# Patient Record
Sex: Male | Born: 1995 | Race: Asian | Hispanic: No | Marital: Single | Smoking: Never smoker
Health system: Southern US, Community
[De-identification: ages and names within clinical notes are randomized; demographics above are authoritative.]

## PROBLEM LIST (undated history)

## (undated) DIAGNOSIS — S36039A Unspecified laceration of spleen, initial encounter: Secondary | ICD-10-CM

## (undated) DIAGNOSIS — S36113A Laceration of liver, unspecified degree, initial encounter: Secondary | ICD-10-CM

## (undated) DIAGNOSIS — S272XXA Traumatic hemopneumothorax, initial encounter: Secondary | ICD-10-CM

## (undated) DIAGNOSIS — A419 Sepsis, unspecified organism: Secondary | ICD-10-CM

## (undated) DIAGNOSIS — W3400XA Accidental discharge from unspecified firearms or gun, initial encounter: Secondary | ICD-10-CM

## (undated) DIAGNOSIS — J189 Pneumonia, unspecified organism: Secondary | ICD-10-CM

## (undated) DIAGNOSIS — S2231XA Fracture of one rib, right side, initial encounter for closed fracture: Secondary | ICD-10-CM

## (undated) HISTORY — PX: GASTRORRHAPHY: SHX6263

---

## 2017-07-19 ENCOUNTER — Emergency Department (HOSPITAL_COMMUNITY): Payer: Self-pay | Admitting: Anesthesiology

## 2017-07-19 ENCOUNTER — Emergency Department (HOSPITAL_COMMUNITY): Payer: Self-pay

## 2017-07-19 ENCOUNTER — Inpatient Hospital Stay (HOSPITAL_COMMUNITY): Payer: Self-pay

## 2017-07-19 ENCOUNTER — Other Ambulatory Visit: Payer: Self-pay

## 2017-07-19 ENCOUNTER — Encounter (HOSPITAL_COMMUNITY): Payer: Self-pay | Admitting: General Surgery

## 2017-07-19 ENCOUNTER — Inpatient Hospital Stay (HOSPITAL_COMMUNITY)
Admission: EM | Admit: 2017-07-19 | Discharge: 2017-07-26 | DRG: 958 | Disposition: A | Discharge status: Home / Self Care | Payer: Self-pay | Attending: General Surgery | Admitting: General Surgery

## 2017-07-19 ENCOUNTER — Encounter (HOSPITAL_COMMUNITY): Admission: EM | Disposition: A | Discharge status: Home / Self Care | Payer: Self-pay | Source: Home / Self Care

## 2017-07-19 DIAGNOSIS — J939 Pneumothorax, unspecified: Secondary | ICD-10-CM

## 2017-07-19 DIAGNOSIS — S272XXA Traumatic hemopneumothorax, initial encounter: Secondary | ICD-10-CM | POA: Diagnosis present

## 2017-07-19 DIAGNOSIS — S2243XA Multiple fractures of ribs, bilateral, initial encounter for closed fracture: Secondary | ICD-10-CM | POA: Diagnosis present

## 2017-07-19 DIAGNOSIS — N179 Acute kidney failure, unspecified: Secondary | ICD-10-CM | POA: Diagnosis present

## 2017-07-19 DIAGNOSIS — Z0189 Encounter for other specified special examinations: Secondary | ICD-10-CM

## 2017-07-19 DIAGNOSIS — E872 Acidosis: Secondary | ICD-10-CM | POA: Diagnosis present

## 2017-07-19 DIAGNOSIS — R05 Cough: Secondary | ICD-10-CM

## 2017-07-19 DIAGNOSIS — Z4682 Encounter for fitting and adjustment of non-vascular catheter: Secondary | ICD-10-CM

## 2017-07-19 DIAGNOSIS — W3400XA Accidental discharge from unspecified firearms or gun, initial encounter: Secondary | ICD-10-CM

## 2017-07-19 DIAGNOSIS — S21331A Puncture wound without foreign body of right front wall of thorax with penetration into thoracic cavity, initial encounter: Principal | ICD-10-CM | POA: Diagnosis present

## 2017-07-19 DIAGNOSIS — D62 Acute posthemorrhagic anemia: Secondary | ICD-10-CM | POA: Diagnosis not present

## 2017-07-19 DIAGNOSIS — Z9889 Other specified postprocedural states: Secondary | ICD-10-CM

## 2017-07-19 DIAGNOSIS — S36039A Unspecified laceration of spleen, initial encounter: Secondary | ICD-10-CM | POA: Diagnosis present

## 2017-07-19 DIAGNOSIS — R059 Cough, unspecified: Secondary | ICD-10-CM

## 2017-07-19 DIAGNOSIS — S36113A Laceration of liver, unspecified degree, initial encounter: Secondary | ICD-10-CM | POA: Diagnosis present

## 2017-07-19 DIAGNOSIS — S0001XA Abrasion of scalp, initial encounter: Secondary | ICD-10-CM | POA: Diagnosis present

## 2017-07-19 DIAGNOSIS — Y249XXA Unspecified firearm discharge, undetermined intent, initial encounter: Secondary | ICD-10-CM

## 2017-07-19 DIAGNOSIS — R402412 Glasgow coma scale score 13-15, at arrival to emergency department: Secondary | ICD-10-CM | POA: Diagnosis present

## 2017-07-19 DIAGNOSIS — R739 Hyperglycemia, unspecified: Secondary | ICD-10-CM | POA: Diagnosis present

## 2017-07-19 DIAGNOSIS — S81032A Puncture wound without foreign body, left knee, initial encounter: Secondary | ICD-10-CM | POA: Diagnosis present

## 2017-07-19 DIAGNOSIS — S31141A Puncture wound of abdominal wall with foreign body, left upper quadrant without penetration into peritoneal cavity, initial encounter: Secondary | ICD-10-CM | POA: Diagnosis present

## 2017-07-19 DIAGNOSIS — E876 Hypokalemia: Secondary | ICD-10-CM | POA: Diagnosis present

## 2017-07-19 HISTORY — PX: LAPAROTOMY: SHX154

## 2017-07-19 LAB — CBC
HCT: 36 % — ABNORMAL LOW (ref 39.0–52.0)
HEMATOCRIT: 31.7 % — AB (ref 39.0–52.0)
HEMATOCRIT: 40.9 % (ref 39.0–52.0)
HEMOGLOBIN: 11.5 g/dL — AB (ref 13.0–17.0)
Hemoglobin: 10.6 g/dL — ABNORMAL LOW (ref 13.0–17.0)
Hemoglobin: 13.6 g/dL (ref 13.0–17.0)
MCH: 30.1 pg (ref 26.0–34.0)
MCH: 30.6 pg (ref 26.0–34.0)
MCH: 30.7 pg (ref 26.0–34.0)
MCHC: 31.9 g/dL (ref 30.0–36.0)
MCHC: 33.3 g/dL (ref 30.0–36.0)
MCHC: 33.4 g/dL (ref 30.0–36.0)
MCV: 91.6 fL (ref 78.0–100.0)
MCV: 92.3 fL (ref 78.0–100.0)
MCV: 94.2 fL (ref 78.0–100.0)
PLATELETS: 215 10*3/uL (ref 150–400)
PLATELETS: 226 10*3/uL (ref 150–400)
Platelets: 180 10*3/uL (ref 150–400)
RBC: 3.46 MIL/uL — ABNORMAL LOW (ref 4.22–5.81)
RBC: 3.82 MIL/uL — AB (ref 4.22–5.81)
RBC: 4.43 MIL/uL (ref 4.22–5.81)
RDW: 12.6 % (ref 11.5–15.5)
RDW: 12.7 % (ref 11.5–15.5)
RDW: 12.8 % (ref 11.5–15.5)
WBC: 12 10*3/uL — AB (ref 4.0–10.5)
WBC: 20.5 10*3/uL — AB (ref 4.0–10.5)
WBC: 23.8 10*3/uL — ABNORMAL HIGH (ref 4.0–10.5)

## 2017-07-19 LAB — TYPE AND SCREEN
ABO/RH(D): O POS
ANTIBODY SCREEN: NEGATIVE
Unit division: 0
Unit division: 0

## 2017-07-19 LAB — URINALYSIS, ROUTINE W REFLEX MICROSCOPIC
Bacteria, UA: NONE SEEN
Bilirubin Urine: NEGATIVE
GLUCOSE, UA: 150 mg/dL — AB
HGB URINE DIPSTICK: NEGATIVE
Ketones, ur: NEGATIVE mg/dL
Leukocytes, UA: NEGATIVE
NITRITE: NEGATIVE
Protein, ur: 100 mg/dL — AB
SPECIFIC GRAVITY, URINE: 1.033 — AB (ref 1.005–1.030)
pH: 5 (ref 5.0–8.0)

## 2017-07-19 LAB — PREPARE FRESH FROZEN PLASMA
UNIT DIVISION: 0
Unit division: 0

## 2017-07-19 LAB — BLOOD PRODUCT ORDER (VERBAL) VERIFICATION

## 2017-07-19 LAB — ETHANOL

## 2017-07-19 LAB — I-STAT CHEM 8, ED
BUN: 9 mg/dL (ref 6–20)
CHLORIDE: 107 mmol/L (ref 101–111)
CREATININE: 1.2 mg/dL (ref 0.61–1.24)
Calcium, Ion: 1.02 mmol/L — ABNORMAL LOW (ref 1.15–1.40)
Glucose, Bld: 146 mg/dL — ABNORMAL HIGH (ref 65–99)
HEMATOCRIT: 40 % (ref 39.0–52.0)
Hemoglobin: 13.6 g/dL (ref 13.0–17.0)
Potassium: 3.1 mmol/L — ABNORMAL LOW (ref 3.5–5.1)
SODIUM: 142 mmol/L (ref 135–145)
TCO2: 21 mmol/L — AB (ref 22–32)

## 2017-07-19 LAB — BPAM RBC
BLOOD PRODUCT EXPIRATION DATE: 201905272359
Blood Product Expiration Date: 201905272359
ISSUE DATE / TIME: 201905010006
ISSUE DATE / TIME: 201905010006
UNIT TYPE AND RH: 9500
UNIT TYPE AND RH: 9500

## 2017-07-19 LAB — BASIC METABOLIC PANEL
ANION GAP: 10 (ref 5–15)
BUN: 11 mg/dL (ref 6–20)
CHLORIDE: 106 mmol/L (ref 101–111)
CO2: 24 mmol/L (ref 22–32)
CREATININE: 1.25 mg/dL — AB (ref 0.61–1.24)
Calcium: 7.5 mg/dL — ABNORMAL LOW (ref 8.9–10.3)
GFR calc Af Amer: >60 mL/min (ref >60)
GFR calc non Af Amer: >60 mL/min (ref >60)
Glucose, Bld: 221 mg/dL — ABNORMAL HIGH (ref 65–99)
POTASSIUM: 4.2 mmol/L (ref 3.5–5.1)
SODIUM: 140 mmol/L (ref 135–145)

## 2017-07-19 LAB — COMPREHENSIVE METABOLIC PANEL
ALBUMIN: 3.6 g/dL (ref 3.5–5.0)
ALK PHOS: 49 U/L (ref 38–126)
ALT: 220 U/L — AB (ref 17–63)
AST: 204 U/L — AB (ref 15–41)
Anion gap: 10 (ref 5–15)
BUN: 8 mg/dL (ref 6–20)
CALCIUM: 8.4 mg/dL — AB (ref 8.9–10.3)
CO2: 21 mmol/L — ABNORMAL LOW (ref 22–32)
CREATININE: 1.26 mg/dL — AB (ref 0.61–1.24)
Chloride: 108 mmol/L (ref 101–111)
GFR calc Af Amer: >60 mL/min (ref >60)
GLUCOSE: 145 mg/dL — AB (ref 65–99)
POTASSIUM: 3.2 mmol/L — AB (ref 3.5–5.1)
Sodium: 139 mmol/L (ref 135–145)
Total Bilirubin: 0.7 mg/dL (ref 0.3–1.2)
Total Protein: 6.4 g/dL — ABNORMAL LOW (ref 6.5–8.1)

## 2017-07-19 LAB — BPAM FFP
BLOOD PRODUCT EXPIRATION DATE: 201905012359
BLOOD PRODUCT EXPIRATION DATE: 201905012359
ISSUE DATE / TIME: 201905010007
ISSUE DATE / TIME: 201905010007
UNIT TYPE AND RH: 6200
Unit Type and Rh: 6200

## 2017-07-19 LAB — RAPID URINE DRUG SCREEN, HOSP PERFORMED
AMPHETAMINES: NOT DETECTED
BENZODIAZEPINES: NOT DETECTED
Barbiturates: NOT DETECTED
Cocaine: NOT DETECTED
Opiates: POSITIVE — AB
Tetrahydrocannabinol: POSITIVE — AB

## 2017-07-19 LAB — HIV ANTIBODY (ROUTINE TESTING W REFLEX): HIV SCREEN 4TH GENERATION: NONREACTIVE

## 2017-07-19 LAB — I-STAT CG4 LACTIC ACID, ED: Lactic Acid, Venous: 3.43 mmol/L (ref 0.5–1.9)

## 2017-07-19 LAB — PROTIME-INR
INR: 1.13
PROTHROMBIN TIME: 14.4 s (ref 11.4–15.2)

## 2017-07-19 LAB — CDS SEROLOGY

## 2017-07-19 LAB — MRSA PCR SCREENING: MRSA by PCR: NEGATIVE

## 2017-07-19 LAB — ABO/RH: ABO/RH(D): O POS

## 2017-07-19 SURGERY — LAPAROTOMY, EXPLORATORY
Anesthesia: General

## 2017-07-19 MED ORDER — PROPOFOL 10 MG/ML IV BOLUS
INTRAVENOUS | Status: AC
  Filled 2017-07-19: qty 20

## 2017-07-19 MED ORDER — ORAL CARE MOUTH RINSE
15.0000 mL | Freq: Two times a day (BID) | OROMUCOSAL | Status: DC
  Administered 2017-07-19 – 2017-07-26 (×12): 15 mL via OROMUCOSAL

## 2017-07-19 MED ORDER — PROPOFOL 10 MG/ML IV BOLUS
INTRAVENOUS | Status: DC | PRN
  Administered 2017-07-19: 140 mg via INTRAVENOUS

## 2017-07-19 MED ORDER — ONDANSETRON HCL 4 MG/2ML IJ SOLN: 4.0000 mg | Freq: Four times a day (QID) | INTRAMUSCULAR | Status: DC | PRN

## 2017-07-19 MED ORDER — FENTANYL CITRATE (PF) 250 MCG/5ML IJ SOLN
INTRAMUSCULAR | Status: AC
  Filled 2017-07-19: qty 5

## 2017-07-19 MED ORDER — SODIUM CHLORIDE 0.9 % IV SOLN
500.0000 mL | Freq: Once | INTRAVENOUS | Status: AC
  Administered 2017-07-19: 500 mL via INTRAVENOUS

## 2017-07-19 MED ORDER — LIDOCAINE HCL (CARDIAC) PF 100 MG/5ML IV SOSY
PREFILLED_SYRINGE | INTRAVENOUS | Status: DC | PRN
  Administered 2017-07-19: 50 mg via INTRAVENOUS

## 2017-07-19 MED ORDER — HYDROMORPHONE HCL 2 MG/ML IJ SOLN
INTRAMUSCULAR | Status: AC
  Filled 2017-07-19: qty 1

## 2017-07-19 MED ORDER — ONDANSETRON HCL 4 MG/2ML IJ SOLN
4.0000 mg | Freq: Four times a day (QID) | INTRAMUSCULAR | Status: DC | PRN
  Administered 2017-07-19 – 2017-07-25 (×4): 4 mg via INTRAVENOUS
  Filled 2017-07-19 (×4): qty 2

## 2017-07-19 MED ORDER — TETANUS-DIPHTH-ACELL PERTUSSIS 5-2.5-18.5 LF-MCG/0.5 IM SUSP: 0.5000 mL | Freq: Once | INTRAMUSCULAR | Status: DC

## 2017-07-19 MED ORDER — PHENYLEPHRINE HCL 10 MG/ML IJ SOLN
INTRAVENOUS | Status: DC | PRN
  Administered 2017-07-19: 50 ug/min via INTRAVENOUS

## 2017-07-19 MED ORDER — 0.9 % SODIUM CHLORIDE (POUR BTL) OPTIME
TOPICAL | Status: DC | PRN
  Administered 2017-07-19: 5000 mL

## 2017-07-19 MED ORDER — ACETAMINOPHEN 650 MG RE SUPP: 650.0000 mg | Freq: Four times a day (QID) | RECTAL | Status: DC | PRN

## 2017-07-19 MED ORDER — CEFAZOLIN SODIUM-DEXTROSE 2-3 GM-%(50ML) IV SOLR
INTRAVENOUS | Status: DC | PRN
  Administered 2017-07-19: 2 g via INTRAVENOUS

## 2017-07-19 MED ORDER — PHENYLEPHRINE HCL 10 MG/ML IJ SOLN
INTRAMUSCULAR | Status: DC | PRN
  Administered 2017-07-19: 80 ug via INTRAVENOUS

## 2017-07-19 MED ORDER — ONDANSETRON HCL 4 MG/2ML IJ SOLN
4.0000 mg | Freq: Four times a day (QID) | INTRAMUSCULAR | Status: DC | PRN
  Administered 2017-07-19 – 2017-07-20 (×2): 4 mg via INTRAVENOUS
  Filled 2017-07-19 (×2): qty 2

## 2017-07-19 MED ORDER — SUCCINYLCHOLINE CHLORIDE 200 MG/10ML IV SOSY
PREFILLED_SYRINGE | INTRAVENOUS | Status: AC
  Filled 2017-07-19: qty 10

## 2017-07-19 MED ORDER — HYDROMORPHONE HCL 2 MG/ML IJ SOLN
0.2500 mg | INTRAMUSCULAR | Status: DC | PRN
  Administered 2017-07-19 (×2): 0.5 mg via INTRAVENOUS

## 2017-07-19 MED ORDER — SODIUM CHLORIDE 0.9% FLUSH
9.0000 mL | INTRAVENOUS | Status: DC | PRN
  Administered 2017-07-23: 9 mL via INTRAVENOUS
  Filled 2017-07-19: qty 9

## 2017-07-19 MED ORDER — MORPHINE SULFATE 2 MG/ML IV SOLN
INTRAVENOUS | Status: DC
  Administered 2017-07-19: 04:00:00 via INTRAVENOUS
  Administered 2017-07-19: 3 mg via INTRAVENOUS
  Administered 2017-07-19: 5.7 mg via INTRAVENOUS
  Administered 2017-07-19: 9 mg via INTRAVENOUS
  Administered 2017-07-19: 10.5 mg via INTRAVENOUS
  Administered 2017-07-19: 22:00:00 via INTRAVENOUS
  Administered 2017-07-20: 9 mg via INTRAVENOUS
  Administered 2017-07-20: 6 mg via INTRAVENOUS
  Administered 2017-07-20: via INTRAVENOUS
  Administered 2017-07-20 (×2): 6 mg via INTRAVENOUS
  Administered 2017-07-20: 9 mg via INTRAVENOUS
  Administered 2017-07-20: 4.5 mg via INTRAVENOUS
  Administered 2017-07-20: 7 mg via INTRAVENOUS
  Administered 2017-07-21: 10.5 mg via INTRAVENOUS
  Administered 2017-07-21: 18 mg via INTRAVENOUS
  Administered 2017-07-21: 3 mg via INTRAVENOUS
  Administered 2017-07-21: 10.5 mg via INTRAVENOUS
  Administered 2017-07-21: 7.5 mg via INTRAVENOUS
  Administered 2017-07-21 – 2017-07-22 (×3): via INTRAVENOUS
  Administered 2017-07-22: 21.3 mg via INTRAVENOUS
  Administered 2017-07-22: 2 mg via INTRAVENOUS
  Administered 2017-07-22: 9 mg via INTRAVENOUS
  Administered 2017-07-23 (×3): via INTRAVENOUS
  Filled 2017-07-19: qty 30
  Filled 2017-07-19 (×8): qty 25

## 2017-07-19 MED ORDER — LIDOCAINE 2% (20 MG/ML) 5 ML SYRINGE
INTRAMUSCULAR | Status: AC
  Filled 2017-07-19: qty 5

## 2017-07-19 MED ORDER — DEXTROSE IN LACTATED RINGERS 5 % IV SOLN
INTRAVENOUS | Status: DC
  Administered 2017-07-19: 100 mL/h via INTRAVENOUS
  Administered 2017-07-19 – 2017-07-21 (×4): via INTRAVENOUS
  Administered 2017-07-22: 100 mL/h via INTRAVENOUS

## 2017-07-19 MED ORDER — SODIUM CHLORIDE 0.9 % IV SOLN
INTRAVENOUS | Status: DC | PRN
  Administered 2017-07-19 (×2): via INTRAVENOUS

## 2017-07-19 MED ORDER — SUGAMMADEX SODIUM 200 MG/2ML IV SOLN
INTRAVENOUS | Status: DC | PRN
  Administered 2017-07-19: 200 mg via INTRAVENOUS

## 2017-07-19 MED ORDER — DIPHENHYDRAMINE HCL 50 MG/ML IJ SOLN: 12.5000 mg | Freq: Four times a day (QID) | INTRAMUSCULAR | Status: DC | PRN

## 2017-07-19 MED ORDER — ONDANSETRON 4 MG PO TBDP
4.0000 mg | ORAL_TABLET | Freq: Four times a day (QID) | ORAL | Status: DC | PRN
  Administered 2017-07-25: 4 mg via ORAL
  Filled 2017-07-19: qty 1

## 2017-07-19 MED ORDER — SUCCINYLCHOLINE CHLORIDE 20 MG/ML IJ SOLN
INTRAMUSCULAR | Status: DC | PRN
  Administered 2017-07-19: 100 mg via INTRAVENOUS

## 2017-07-19 MED ORDER — ONDANSETRON HCL 4 MG/2ML IJ SOLN
INTRAMUSCULAR | Status: DC | PRN
  Administered 2017-07-19: 4 mg via INTRAVENOUS

## 2017-07-19 MED ORDER — SODIUM CHLORIDE 0.9 % IV SOLN
INTRAVENOUS | Status: DC | PRN
  Administered 2017-07-19: 01:00:00 via INTRAVENOUS

## 2017-07-19 MED ORDER — SODIUM CHLORIDE 0.9 % IV BOLUS
1000.0000 mL | Freq: Once | INTRAVENOUS | Status: AC
  Administered 2017-07-19: 1000 mL via INTRAVENOUS

## 2017-07-19 MED ORDER — ROCURONIUM BROMIDE 100 MG/10ML IV SOLN
INTRAVENOUS | Status: DC | PRN
  Administered 2017-07-19: 50 mg via INTRAVENOUS

## 2017-07-19 MED ORDER — OXYCODONE HCL 5 MG/5ML PO SOLN: 5.0000 mg | Freq: Once | ORAL | Status: DC | PRN

## 2017-07-19 MED ORDER — ONDANSETRON HCL 4 MG/2ML IJ SOLN
4.0000 mg | Freq: Once | INTRAMUSCULAR | Status: AC
  Administered 2017-07-19: 4 mg via INTRAVENOUS
  Filled 2017-07-19: qty 2

## 2017-07-19 MED ORDER — FENTANYL CITRATE (PF) 100 MCG/2ML IJ SOLN
INTRAMUSCULAR | Status: DC | PRN
  Administered 2017-07-19 (×2): 50 ug via INTRAVENOUS
  Administered 2017-07-19: 150 ug via INTRAVENOUS

## 2017-07-19 MED ORDER — FENTANYL CITRATE (PF) 100 MCG/2ML IJ SOLN
50.0000 ug | Freq: Once | INTRAMUSCULAR | Status: AC
  Administered 2017-07-19: 50 ug via INTRAVENOUS
  Filled 2017-07-19: qty 2

## 2017-07-19 MED ORDER — CEFAZOLIN SODIUM-DEXTROSE 2-4 GM/100ML-% IV SOLN
2.0000 g | Freq: Three times a day (TID) | INTRAVENOUS | Status: AC
Start: 2017-07-19 — End: 2017-07-21
  Administered 2017-07-19 – 2017-07-21 (×6): 2 g via INTRAVENOUS
  Filled 2017-07-19 (×7): qty 100

## 2017-07-19 MED ORDER — NALOXONE HCL 0.4 MG/ML IJ SOLN: 0.4000 mg | INTRAMUSCULAR | Status: DC | PRN

## 2017-07-19 MED ORDER — DIPHENHYDRAMINE HCL 12.5 MG/5ML PO ELIX: 12.5000 mg | ORAL_SOLUTION | Freq: Four times a day (QID) | ORAL | Status: DC | PRN

## 2017-07-19 MED ORDER — OXYCODONE HCL 5 MG PO TABS: 5.0000 mg | ORAL_TABLET | Freq: Once | ORAL | Status: DC | PRN

## 2017-07-19 MED ORDER — FAMOTIDINE IN NACL 20-0.9 MG/50ML-% IV SOLN
20.0000 mg | Freq: Two times a day (BID) | INTRAVENOUS | Status: DC
  Administered 2017-07-19 – 2017-07-25 (×13): 20 mg via INTRAVENOUS
  Filled 2017-07-19 (×12): qty 50

## 2017-07-19 SURGICAL SUPPLY — 48 items
BLADE CLIPPER SURG (BLADE) IMPLANT
CANISTER SUCT 3000ML PPV (MISCELLANEOUS) ×3 IMPLANT
CHLORAPREP W/TINT 26ML (MISCELLANEOUS) ×3 IMPLANT
COVER SURGICAL LIGHT HANDLE (MISCELLANEOUS) ×3 IMPLANT
DRAPE LAPAROSCOPIC ABDOMINAL (DRAPES) ×3 IMPLANT
DRAPE WARM FLUID 44X44 (DRAPE) ×3 IMPLANT
DRSG COVADERM 4X10 (GAUZE/BANDAGES/DRESSINGS) ×3 IMPLANT
DRSG OPSITE POSTOP 4X10 (GAUZE/BANDAGES/DRESSINGS) IMPLANT
DRSG OPSITE POSTOP 4X8 (GAUZE/BANDAGES/DRESSINGS) IMPLANT
ELECT BLADE 6.5 EXT (BLADE) IMPLANT
ELECT CAUTERY BLADE 6.4 (BLADE) ×3 IMPLANT
ELECT REM PT RETURN 9FT ADLT (ELECTROSURGICAL) ×3
ELECTRODE REM PT RTRN 9FT ADLT (ELECTROSURGICAL) ×1 IMPLANT
GLOVE BIO SURGEON STRL SZ 6 (GLOVE) ×3 IMPLANT
GLOVE INDICATOR 6.5 STRL GRN (GLOVE) ×3 IMPLANT
GOWN STRL REUS W/ TWL LRG LVL3 (GOWN DISPOSABLE) ×1 IMPLANT
GOWN STRL REUS W/TWL 2XL LVL3 (GOWN DISPOSABLE) ×3 IMPLANT
GOWN STRL REUS W/TWL LRG LVL3 (GOWN DISPOSABLE) ×2
KIT BASIN OR (CUSTOM PROCEDURE TRAY) ×3 IMPLANT
KIT TURNOVER KIT B (KITS) ×3 IMPLANT
LIGASURE IMPACT 36 18CM CVD LR (INSTRUMENTS) IMPLANT
NS IRRIG 1000ML POUR BTL (IV SOLUTION) ×6 IMPLANT
PACK GENERAL/GYN (CUSTOM PROCEDURE TRAY) ×3 IMPLANT
PAD ARMBOARD 7.5X6 YLW CONV (MISCELLANEOUS) ×3 IMPLANT
PLEDGET 1/4X1/8X1/16 (MISCELLANEOUS) ×1 IMPLANT
PLEDGETS 1/4X1/8X1/16 (MISCELLANEOUS) ×3
RELOAD STAPLER LINE PROX 30 GR (STAPLE) ×1 IMPLANT
SPECIMEN JAR LARGE (MISCELLANEOUS) IMPLANT
SPONGE LAP 18X18 X RAY DECT (DISPOSABLE) IMPLANT
STAPLER RELOAD LINE PROX 30 GR (STAPLE) ×3
STAPLER VISISTAT 35W (STAPLE) ×3 IMPLANT
SUCTION POOLE TIP (SUCTIONS) ×3 IMPLANT
SUT PDS II 0 TP-1 LOOPED 60 (SUTURE) ×6 IMPLANT
SUT PROLENE 2 0 CT2 30 (SUTURE) ×12 IMPLANT
SUT SILK 1 SH (SUTURE) ×3 IMPLANT
SUT VIC AB 2-0 SH 18 (SUTURE) ×3 IMPLANT
SUT VIC AB 2-0 SH 27 (SUTURE) ×2
SUT VIC AB 2-0 SH 27XBRD (SUTURE) ×1 IMPLANT
SUT VIC AB 3-0 SH 18 (SUTURE) ×3 IMPLANT
SUT VICRYL 4-0 PS2 18IN ABS (SUTURE) IMPLANT
SUT VICRYL AB 2 0 TIES (SUTURE) ×3 IMPLANT
SUT VICRYL AB 3 0 TIES (SUTURE) ×3 IMPLANT
SYSTEM SAHARA CHEST DRAIN ATS (WOUND CARE) ×6 IMPLANT
TOWEL OR 17X24 6PK STRL BLUE (TOWEL DISPOSABLE) ×3 IMPLANT
TOWEL OR 17X26 10 PK STRL BLUE (TOWEL DISPOSABLE) ×3 IMPLANT
TRAY FOLEY MTR SLVR 16FR STAT (SET/KITS/TRAYS/PACK) IMPLANT
TRAY WAYNE PNEUMOTHORAX 14X18 (TRAY / TRAY PROCEDURE) ×6 IMPLANT
YANKAUER SUCT BULB TIP NO VENT (SUCTIONS) IMPLANT
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
## 2017-07-19 NOTE — Anesthesia Procedure Notes (Signed)
Procedure Name: Intubation Date/Time: 07/19/2017 12:46 AM Performed by: Purvis Kilts, CRNA Pre-anesthesia Checklist: Patient identified, Emergency Drugs available, Suction available, Patient being monitored and Timeout performed Patient Re-evaluated:Patient Re-evaluated prior to induction Oxygen Delivery Method: Circle system utilized Preoxygenation: Pre-oxygenation with 100% oxygen Induction Type: IV induction, Rapid sequence and Cricoid Pressure applied Laryngoscope Size: Mac and 4 Grade View: Grade I Tube type: Oral Tube size: 7.5 mm Number of attempts: 1 Airway Equipment and Method: Stylet Placement Confirmation: ETT inserted through vocal cords under direct vision,  positive ETCO2 and breath sounds checked- equal and bilateral Secured at: 21 cm Tube secured with: Tape Dental Injury: Teeth and Oropharynx as per pre-operative assessment
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
## 2017-07-19 NOTE — Transfer of Care (Signed)
Immediate Anesthesia Transfer of Care Note  Patient: James Little  Procedure(s) Performed: EXPLORATORY LAPAROTOMY (N/A )  Patient Location: PACU  Anesthesia Type:General  Level of Consciousness: awake  Airway & Oxygen Therapy: Patient Spontanous Breathing  Post-op Assessment: Report given to RN and Post -op Vital signs reviewed and stable  Post vital signs: Reviewed and stable  Last Vitals:  Vitals Value Taken Time  BP 126/69 07/19/2017  2:30 AM  Temp 36.2 C 07/19/2017  2:32 AM  Pulse 91 07/19/2017  2:39 AM  Resp 20 07/19/2017  2:39 AM  SpO2 98 % 07/19/2017  2:39 AM  Vitals shown include unvalidated device data.  Last Pain:  Vitals:   07/19/17 0232  TempSrc:   PainSc: Asleep         Complications: No apparent anesthesia complications
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
## 2017-07-19 NOTE — Progress Notes (Signed)
Patientis very stable and looks good.  Will give saline bolus for marginal urine output.  James Little. Dahlia Bailiff, MD, [place] [phone] Trauma Surgeon
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
## 2017-07-20 ENCOUNTER — Encounter (HOSPITAL_COMMUNITY): Payer: Self-pay | Admitting: General Surgery

## 2017-07-20 ENCOUNTER — Inpatient Hospital Stay (HOSPITAL_COMMUNITY): Payer: Self-pay

## 2017-07-20 LAB — CBC WITH DIFFERENTIAL/PLATELET
BASOS PCT: 0 %
Basophils Absolute: 0 10*3/uL (ref 0.0–0.1)
Eosinophils Absolute: 0 10*3/uL (ref 0.0–0.7)
Eosinophils Relative: 0 %
HEMATOCRIT: 25.8 % — AB (ref 39.0–52.0)
HEMOGLOBIN: 8.8 g/dL — AB (ref 13.0–17.0)
Lymphocytes Relative: 13 %
Lymphs Abs: 1.7 10*3/uL (ref 0.7–4.0)
MCH: 30.6 pg (ref 26.0–34.0)
MCHC: 34.1 g/dL (ref 30.0–36.0)
MCV: 89.6 fL (ref 78.0–100.0)
MONOS PCT: 5 %
Monocytes Absolute: 0.7 10*3/uL (ref 0.1–1.0)
NEUTROS ABS: 11.2 10*3/uL — AB (ref 1.7–7.7)
NEUTROS PCT: 82 %
Platelets: 154 10*3/uL (ref 150–400)
RBC: 2.88 MIL/uL — ABNORMAL LOW (ref 4.22–5.81)
RDW: 12.5 % (ref 11.5–15.5)
WBC: 13.7 10*3/uL — ABNORMAL HIGH (ref 4.0–10.5)

## 2017-07-20 LAB — BASIC METABOLIC PANEL
Anion gap: 5 (ref 5–15)
BUN: 8 mg/dL (ref 6–20)
CHLORIDE: 105 mmol/L (ref 101–111)
CO2: 28 mmol/L (ref 22–32)
CREATININE: 1 mg/dL (ref 0.61–1.24)
Calcium: 7.8 mg/dL — ABNORMAL LOW (ref 8.9–10.3)
GFR calc non Af Amer: >60 mL/min (ref >60)
Glucose, Bld: 141 mg/dL — ABNORMAL HIGH (ref 65–99)
Potassium: 4.3 mmol/L (ref 3.5–5.1)
Sodium: 138 mmol/L (ref 135–145)

## 2017-07-20 NOTE — Progress Notes (Signed)
Patient arrived from 4N30, girlfriend at bedside.  Patient oriented to room/unit.  Vitals stable.  xxx status in place with password. Continue to monitor.
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
## 2017-07-21 ENCOUNTER — Inpatient Hospital Stay (HOSPITAL_COMMUNITY): Payer: Self-pay

## 2017-07-21 ENCOUNTER — Encounter (HOSPITAL_COMMUNITY): Payer: Self-pay | Admitting: General Surgery

## 2017-07-21 LAB — BASIC METABOLIC PANEL
Anion gap: 6 (ref 5–15)
BUN: 6 mg/dL (ref 6–20)
CALCIUM: 7.9 mg/dL — AB (ref 8.9–10.3)
CO2: 27 mmol/L (ref 22–32)
CREATININE: 1.03 mg/dL (ref 0.61–1.24)
Chloride: 100 mmol/L — ABNORMAL LOW (ref 101–111)
GFR calc Af Amer: >60 mL/min (ref >60)
GLUCOSE: 119 mg/dL — AB (ref 65–99)
POTASSIUM: 3.6 mmol/L (ref 3.5–5.1)
Sodium: 133 mmol/L — ABNORMAL LOW (ref 135–145)

## 2017-07-21 LAB — CBC
HCT: 22.7 % — ABNORMAL LOW (ref 39.0–52.0)
Hemoglobin: 7.9 g/dL — ABNORMAL LOW (ref 13.0–17.0)
MCH: 31.1 pg (ref 26.0–34.0)
MCHC: 34.8 g/dL (ref 30.0–36.0)
MCV: 89.4 fL (ref 78.0–100.0)
PLATELETS: 149 10*3/uL — AB (ref 150–400)
RBC: 2.54 MIL/uL — ABNORMAL LOW (ref 4.22–5.81)
RDW: 12.1 % (ref 11.5–15.5)
WBC: 10.8 10*3/uL — ABNORMAL HIGH (ref 4.0–10.5)

## 2017-07-21 MED ORDER — ACETAMINOPHEN 325 MG PO TABS
650.0000 mg | ORAL_TABLET | Freq: Four times a day (QID) | ORAL | Status: DC | PRN
  Administered 2017-07-21: 650 mg via ORAL
  Filled 2017-07-21: qty 2

## 2017-07-21 NOTE — Progress Notes (Signed)
RN called into room by patient. Pt was complaining of arm pain and swelling, on assessment RN noted that pts arm where his IV was running was severely swollen. RN stopped IV fluids that were running, called Pharmacy and paged the MD. Awaiting orders at this time

## 2017-07-21 NOTE — Anesthesia Postprocedure Evaluation (Signed)
Anesthesia Post Note  Patient: James Little  Procedure(s) Performed: EXPLORATORY LAPAROTOMY (N/A )     Patient location during evaluation: PACU Anesthesia Type: General Level of consciousness: awake and alert Pain management: pain level controlled Vital Signs Assessment: post-procedure vital signs reviewed and stable Respiratory status: spontaneous breathing, nonlabored ventilation, respiratory function stable and patient connected to nasal cannula oxygen Cardiovascular status: blood pressure returned to baseline and stable Postop Assessment: no apparent nausea or vomiting Anesthetic complications: no    Last Vitals:  Vitals:   07/21/17 0405 07/21/17 0458  BP:    Pulse:    Resp:  18  Temp: 37.5 C   SpO2:  99%    Last Pain:  Vitals:   07/21/17 0458  TempSrc:   PainSc: 0-No pain                 [name]
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
## 2017-07-21 NOTE — Progress Notes (Signed)
MD aware of pts arm. Ordered to give hot packs and elevate arm. Will continue to monitor, IV team will be consulted for new accesses.

## 2017-07-21 NOTE — Progress Notes (Signed)
PCA replacement sent by pharmacy, wasted ~58ml with Rebeca Allegra, RN as unable to waste in pyxis.

## 2017-07-21 NOTE — Progress Notes (Addendum)
Patient ambulated in hall approximately 63ft, HR increased without sustaining 140s-150s, pt became nauseas at end of walk, subsided once in bed.  Ambulated again before shift change, 159ft.  Pain tolerated well during both walks. Encouraged patient to increase usage of IS. Continue to monitor.

## 2017-07-22 ENCOUNTER — Inpatient Hospital Stay (HOSPITAL_COMMUNITY): Payer: Self-pay

## 2017-07-22 LAB — BASIC METABOLIC PANEL
Anion gap: 9 (ref 5–15)
CHLORIDE: 98 mmol/L — AB (ref 101–111)
CO2: 28 mmol/L (ref 22–32)
Calcium: 8.2 mg/dL — ABNORMAL LOW (ref 8.9–10.3)
Creatinine, Ser: 0.96 mg/dL (ref 0.61–1.24)
GFR calc Af Amer: >60 mL/min (ref >60)
GFR calc non Af Amer: >60 mL/min (ref >60)
Glucose, Bld: 107 mg/dL — ABNORMAL HIGH (ref 65–99)
POTASSIUM: 3.3 mmol/L — AB (ref 3.5–5.1)
Sodium: 135 mmol/L (ref 135–145)

## 2017-07-22 LAB — CBC
HCT: 21.5 % — ABNORMAL LOW (ref 39.0–52.0)
HEMOGLOBIN: 7.4 g/dL — AB (ref 13.0–17.0)
MCH: 30.8 pg (ref 26.0–34.0)
MCHC: 34.4 g/dL (ref 30.0–36.0)
MCV: 89.6 fL (ref 78.0–100.0)
Platelets: 186 10*3/uL (ref 150–400)
RBC: 2.4 MIL/uL — AB (ref 4.22–5.81)
RDW: 12.3 % (ref 11.5–15.5)
WBC: 11.8 10*3/uL — ABNORMAL HIGH (ref 4.0–10.5)

## 2017-07-22 MED ORDER — POTASSIUM CHLORIDE 10 MEQ/100ML IV SOLN
10.0000 meq | INTRAVENOUS | Status: AC
Start: 2017-07-22 — End: 2017-07-22
  Administered 2017-07-22: 10 meq via INTRAVENOUS

## 2017-07-22 MED ORDER — POTASSIUM CHLORIDE 10 MEQ/100ML IV SOLN
INTRAVENOUS | Status: AC
  Filled 2017-07-22: qty 200

## 2017-07-22 NOTE — Progress Notes (Signed)
MD paged regarding patient's new lab values. Awaiting call back.
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
## 2017-07-23 ENCOUNTER — Inpatient Hospital Stay (HOSPITAL_COMMUNITY): Payer: Self-pay

## 2017-07-23 LAB — BASIC METABOLIC PANEL
Anion gap: 6 (ref 5–15)
BUN: <5 mg/dL — ABNORMAL LOW (ref 6–20)
CALCIUM: 8.2 mg/dL — AB (ref 8.9–10.3)
CO2: 31 mmol/L (ref 22–32)
CREATININE: 0.94 mg/dL (ref 0.61–1.24)
Chloride: 99 mmol/L — ABNORMAL LOW (ref 101–111)
GFR calc non Af Amer: >60 mL/min (ref >60)
GLUCOSE: 107 mg/dL — AB (ref 65–99)
Potassium: 3 mmol/L — ABNORMAL LOW (ref 3.5–5.1)
Sodium: 136 mmol/L (ref 135–145)

## 2017-07-23 LAB — CBC
HEMATOCRIT: 19.5 % — AB (ref 39.0–52.0)
Hemoglobin: 6.6 g/dL — CL (ref 13.0–17.0)
MCH: 30.3 pg (ref 26.0–34.0)
MCHC: 33.8 g/dL (ref 30.0–36.0)
MCV: 89.4 fL (ref 78.0–100.0)
Platelets: 228 10*3/uL (ref 150–400)
RBC: 2.18 MIL/uL — ABNORMAL LOW (ref 4.22–5.81)
RDW: 12 % (ref 11.5–15.5)
WBC: 12.5 10*3/uL — ABNORMAL HIGH (ref 4.0–10.5)

## 2017-07-23 LAB — PREPARE RBC (CROSSMATCH)

## 2017-07-23 MED ORDER — SODIUM CHLORIDE 0.9 % IV SOLN
Freq: Once | INTRAVENOUS | Status: AC
  Administered 2017-07-23: 06:00:00 via INTRAVENOUS

## 2017-07-23 MED ORDER — DIPHENHYDRAMINE HCL 50 MG/ML IJ SOLN
12.5000 mg | Freq: Once | INTRAMUSCULAR | Status: AC
  Administered 2017-07-23: 12.5 mg via INTRAVENOUS
  Filled 2017-07-23: qty 1

## 2017-07-23 MED ORDER — ACETAMINOPHEN 325 MG PO TABS
650.0000 mg | ORAL_TABLET | Freq: Once | ORAL | Status: AC
  Administered 2017-07-23: 650 mg via ORAL
  Filled 2017-07-23: qty 2

## 2017-07-23 MED ORDER — POTASSIUM CHLORIDE 10 MEQ/100ML IV SOLN
10.0000 meq | INTRAVENOUS | Status: AC
  Administered 2017-07-23 (×4): 10 meq via INTRAVENOUS
  Filled 2017-07-23: qty 100

## 2017-07-23 MED ORDER — KCL IN DEXTROSE-NACL 20-5-0.45 MEQ/L-%-% IV SOLN
INTRAVENOUS | Status: DC
  Administered 2017-07-23: 100 mL/h via INTRAVENOUS
  Administered 2017-07-24 – 2017-07-26 (×5): via INTRAVENOUS
  Filled 2017-07-23 (×6): qty 1000

## 2017-07-23 NOTE — Progress Notes (Signed)
MD paged regarding critical lab values. New orders placed.
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
## 2017-07-24 ENCOUNTER — Inpatient Hospital Stay (HOSPITAL_COMMUNITY): Payer: Self-pay

## 2017-07-24 LAB — BASIC METABOLIC PANEL
Anion gap: 7 (ref 5–15)
CALCIUM: 8.2 mg/dL — AB (ref 8.9–10.3)
CO2: 28 mmol/L (ref 22–32)
CREATININE: 0.83 mg/dL (ref 0.61–1.24)
Chloride: 102 mmol/L (ref 101–111)
Glucose, Bld: 106 mg/dL — ABNORMAL HIGH (ref 65–99)
Potassium: 3.6 mmol/L (ref 3.5–5.1)
SODIUM: 137 mmol/L (ref 135–145)

## 2017-07-24 LAB — TYPE AND SCREEN
ABO/RH(D): O POS
Antibody Screen: NEGATIVE
UNIT DIVISION: 0

## 2017-07-24 LAB — CBC
HCT: 23.9 % — ABNORMAL LOW (ref 39.0–52.0)
HEMOGLOBIN: 8.1 g/dL — AB (ref 13.0–17.0)
MCH: 30.2 pg (ref 26.0–34.0)
MCHC: 33.9 g/dL (ref 30.0–36.0)
MCV: 89.2 fL (ref 78.0–100.0)
PLATELETS: 295 10*3/uL (ref 150–400)
RBC: 2.68 MIL/uL — ABNORMAL LOW (ref 4.22–5.81)
RDW: 12.4 % (ref 11.5–15.5)
WBC: 12.2 10*3/uL — ABNORMAL HIGH (ref 4.0–10.5)

## 2017-07-24 LAB — BPAM RBC
Blood Product Expiration Date: 201905282359
ISSUE DATE / TIME: 201905050601
UNIT TYPE AND RH: 5100

## 2017-07-24 MED ORDER — ACETAMINOPHEN 500 MG PO TABS
1000.0000 mg | ORAL_TABLET | Freq: Three times a day (TID) | ORAL | Status: DC
  Administered 2017-07-24 – 2017-07-26 (×5): 1000 mg via ORAL
  Filled 2017-07-24 (×8): qty 2

## 2017-07-24 MED ORDER — BACITRACIN-NEOMYCIN-POLYMYXIN OINTMENT TUBE
1.0000 "application " | TOPICAL_OINTMENT | Freq: Two times a day (BID) | CUTANEOUS | Status: DC
  Administered 2017-07-24 – 2017-07-26 (×4): 1 via TOPICAL
  Filled 2017-07-24: qty 14.17

## 2017-07-24 MED ORDER — METHOCARBAMOL 500 MG PO TABS
1000.0000 mg | ORAL_TABLET | Freq: Four times a day (QID) | ORAL | Status: DC | PRN
  Administered 2017-07-24 – 2017-07-26 (×7): 1000 mg via ORAL
  Filled 2017-07-24 (×7): qty 2

## 2017-07-24 MED ORDER — OXYCODONE HCL 5 MG PO TABS
5.0000 mg | ORAL_TABLET | ORAL | Status: DC | PRN
  Administered 2017-07-24 – 2017-07-25 (×5): 5 mg via ORAL
  Filled 2017-07-24 (×5): qty 1

## 2017-07-24 MED ORDER — MORPHINE SULFATE (PF) 4 MG/ML IV SOLN
2.0000 mg | INTRAVENOUS | Status: DC | PRN
  Administered 2017-07-24 – 2017-07-25 (×5): 2 mg via INTRAVENOUS
  Filled 2017-07-24 (×6): qty 1
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
## 2017-07-25 LAB — CBC
HCT: 26.2 % — ABNORMAL LOW (ref 39.0–52.0)
HEMOGLOBIN: 8.7 g/dL — AB (ref 13.0–17.0)
MCH: 29.7 pg (ref 26.0–34.0)
MCHC: 33.2 g/dL (ref 30.0–36.0)
MCV: 89.4 fL (ref 78.0–100.0)
PLATELETS: 421 10*3/uL — AB (ref 150–400)
RBC: 2.93 MIL/uL — AB (ref 4.22–5.81)
RDW: 12.4 % (ref 11.5–15.5)
WBC: 11.9 10*3/uL — ABNORMAL HIGH (ref 4.0–10.5)

## 2017-07-25 MED ORDER — MORPHINE SULFATE (PF) 4 MG/ML IV SOLN
2.0000 mg | INTRAVENOUS | Status: DC | PRN
  Administered 2017-07-25 (×2): 2 mg via INTRAVENOUS
  Filled 2017-07-25: qty 1

## 2017-07-25 MED ORDER — POLYETHYLENE GLYCOL 3350 17 G PO PACK
17.0000 g | PACK | Freq: Every day | ORAL | Status: DC
  Administered 2017-07-26: 17 g via ORAL
  Filled 2017-07-25 (×2): qty 1

## 2017-07-25 MED ORDER — OXYCODONE HCL 5 MG PO TABS
5.0000 mg | ORAL_TABLET | ORAL | Status: DC | PRN
  Administered 2017-07-25: 10 mg via ORAL
  Administered 2017-07-25: 5 mg via ORAL
  Administered 2017-07-26: 10 mg via ORAL
  Filled 2017-07-25 (×2): qty 2
  Filled 2017-07-25: qty 1
  Filled 2017-07-25: qty 2

## 2017-07-25 MED ORDER — SIMETHICONE 80 MG PO CHEW: 80.0000 mg | CHEWABLE_TABLET | Freq: Four times a day (QID) | ORAL | Status: DC | PRN

## 2017-07-25 MED ORDER — TRAMADOL HCL 50 MG PO TABS
50.0000 mg | ORAL_TABLET | Freq: Four times a day (QID) | ORAL | Status: DC
  Administered 2017-07-25 – 2017-07-26 (×4): 50 mg via ORAL
  Filled 2017-07-25 (×6): qty 1

## 2017-07-25 MED ORDER — FAMOTIDINE 20 MG PO TABS
20.0000 mg | ORAL_TABLET | Freq: Two times a day (BID) | ORAL | Status: DC
  Administered 2017-07-25 – 2017-07-26 (×2): 20 mg via ORAL
  Filled 2017-07-25 (×2): qty 1

## 2017-07-25 MED ORDER — DOCUSATE SODIUM 100 MG PO CAPS
100.0000 mg | ORAL_CAPSULE | Freq: Two times a day (BID) | ORAL | Status: DC
  Administered 2017-07-26: 100 mg via ORAL
  Filled 2017-07-25 (×3): qty 1
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
## 2017-07-26 LAB — CBC
HCT: 25.6 % — ABNORMAL LOW (ref 39.0–52.0)
HEMOGLOBIN: 8.5 g/dL — AB (ref 13.0–17.0)
MCH: 29.7 pg (ref 26.0–34.0)
MCHC: 33.2 g/dL (ref 30.0–36.0)
MCV: 89.5 fL (ref 78.0–100.0)
PLATELETS: 504 10*3/uL — AB (ref 150–400)
RBC: 2.86 MIL/uL — AB (ref 4.22–5.81)
RDW: 12.5 % (ref 11.5–15.5)
WBC: 13.2 10*3/uL — AB (ref 4.0–10.5)

## 2017-07-26 MED ORDER — ENOXAPARIN SODIUM 40 MG/0.4ML ~~LOC~~ SOLN
40.0000 mg | SUBCUTANEOUS | Status: DC
  Administered 2017-07-26: 40 mg via SUBCUTANEOUS
  Filled 2017-07-26: qty 0.4

## 2017-07-26 MED ORDER — FAMOTIDINE 20 MG PO TABS: 20.0000 mg | ORAL_TABLET | Freq: Every day | ORAL | Status: DC

## 2017-07-26 MED ORDER — ACETAMINOPHEN 500 MG PO TABS: 1000.0000 mg | ORAL_TABLET | Freq: Three times a day (TID) | ORAL | 0 refills | Status: DC

## 2017-07-26 MED ORDER — TRAMADOL HCL 50 MG PO TABS
50.0000 mg | ORAL_TABLET | Freq: Four times a day (QID) | ORAL | 0 refills | Status: AC | PRN
Start: 2017-07-26 — End: 2017-08-02

## 2017-07-26 MED ORDER — POLYETHYLENE GLYCOL 3350 17 G PO PACK: 17.0000 g | PACK | Freq: Every day | ORAL | 0 refills | Status: DC
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
## 2017-07-26 NOTE — Discharge Instructions (Signed)
[facility], [place] [phone]  Splenic Injury A splenic injury is an injury of the spleen. The spleen is an organ located in the upper left area of your abdomen, just under your ribs. Your spleen filters and cleans your blood. It also stores blood cells and destroys cells that are worn out. Your spleen is also important for fighting disease. Splenic injuries can vary. In some cases, the spleen may only be bruised with some bleeding inside the covering and around the spleen. Splenic injuries may also cause a deep tear or cut into the spleen (lacerated spleen). Some splenic injuries can cause the spleen to break open (rupture). What are the causes? Splenic injuries can be caused by a direct blow (blunt trauma) from:  Car accidents.  Contact sports.  Falls.  Gunshot wounds or knife wounds (penetrating injuries) can also cause a splenic injury. What increases the risk? You may be at greater risk for a splenic injury if you have a disease that can cause the spleen to become enlarged. These include:  Alcoholic liver disease.  Viral infections, especially mononucleosis.  What are the signs or symptoms? A minor splenic injury often causes no symptoms or only minor abdominal pain. If the injury causes severe bleeding, your blood pressure may rapidly decrease. This may cause:  Dizziness or light-headedness.  Rapid heart rate.  Difficulty breathing.  Fainting.  Sweating with clammy skin.  Other signs and symptoms of a splenic injury can include:  Very bad abdominal pain.  Pain in the left shoulder.  Pain when the abdomen is pressed (tenderness).  Nausea.  Swelling or bruising of the abdomen.  How is this diagnosed? Your health care provider may suspect a splenic injury based on your signs and symptoms, especially if you were recently in an accident or you recently got hurt. Your health care provider will do a physical exam. Imaging tests may be done to  confirm the diagnosis. These may include:  Ultrasound.  CT scan.  You may have frequent blood tests for a few days after the injury to monitor your condition. How is this treated? Treatment depends on the type of splenic injury you have and how bad it is. Your health care provider will develop a treatment plan specific to your needs.  Less severe injuries may be treated with: ? Observation. ? Interventional radiology. This involves using flexible tubes (catheters) to stop the bleeding from inside the blood vessel.  More severe injuries may require hospitalization in the intensive care unit (ICU). While you are in the ICU: ? Your fluid and blood levels will be monitored closely. ? You will get fluids through an IV tube as needed. ? You may need follow-up scans to check whether your spleen is able to heal itself. If the injury is getting worse, you may need surgery. ? You may receive donated blood (transfusion). ? You may have a long needle inserted into your abdomen to remove any blood that has collected inside the spleen (hematoma).  Surgery. If your blood pressure is too low, you may need emergency surgery. This may include: ? Repairing a laceration. ? Removing part of the spleen. ? Removing the entire spleen (splenectomy).  Follow these instructions at home:  Take medicines only as directed by your health care provider.  Rest at home.  Do not participate in any strenuous activity until your health care provider says it is safe to do so.  Do not lift anything that is heavier  than 10 lb (4.5 kg).  Do not participate in contact sports until your health care provider says it is safe to do so.  Stay up-to-date on vaccinations as told by your health care provider. Contact a health care provider if:  You have a fever.  You have new or increasing pain in your abdomen or in your left shoulder. Get help right away if:  You have signs or symptoms of internal bleeding. Watch  for: ? Sweating. ? Dizziness. ? Weakness. ? Cold and clammy skin. ? Fainting.  You have chest pain or difficulty breathing. This information is not intended to replace advice given to you by your health care provider. Make sure you discuss any questions you have with your health care provider. Document Released: 12/27/2005 Document Revised: 11/03/2015 Document Reviewed: 11/20/2013 Elsevier Interactive Patient Education  2017 [employer]  Liver Laceration A liver laceration is a tear or a cut in the liver. The liver is an organ that is involved in many important bodily functions. Sometimes, a liver laceration can be a very serious injury. It can cause a lot of bleeding, and surgery may be needed. Other times, a liver laceration may be minor, and bed rest may be all that is needed. Either way, treatment in a hospital is almost always required. Liver lacerations are categorized in grades from 1 to 5. Low numbers identify lacerations that are less severe than lacerations with high numbers. Grade 1: This is a tear in the outer lining of the liver. It is less than  inch (1 cm) deep. Grade 2: This is a tear that is about  inch to 1 inch (1 to 3 cm) deep. It is less than 4 inches (10 cm) long. Grade 3: This is a tear that is slightly more than 1 inch (3 cm) deep. Grades 4 and 5: These lacerations are very deep. They affect a large part of the liver.  What are the causes? This condition may be caused by: A forceful hit to the area around the liver (blunt trauma), such as in a car crash. Blunt trauma can tear the liver even though it does not break the skin. An injury in which an object goes through the skin and into the liver (penetrating injury), such as a stab or gunshot wound.  What are the signs or symptoms? Common symptoms of this condition include: A swollen and firm abdomen. Pain in the abdomen. Tenderness when pressing on the right side of the abdomen.  Other symptoms  include: Bleeding from a penetrating wound. Bruises on the abdomen. A fast heartbeat. Taking quick breaths. Feeling weak and dizzy.  How is this diagnosed? To diagnose this condition, your health care provider will do a physical exam and ask about any injuries to the right side of your abdomen. You may have various tests, such as: Blood tests. Your blood may be tested every few hours. This will show whether you are losing blood. CT scan. This test is done to check for laceration or bleeding. Laparoscopy. This involves placing a small camera into the abdomen and looking directly at the surface of the liver.  How is this treated? Treatment depends on how deep the laceration is and how much bleeding you have. Treatment options include: Monitoring and bed rest at the hospital. You will have tests often. Receiving donated blood through an IV tube (transfusion) to replace blood that you have lost. You may need several transfusions. Surgery to pack gauze pads or special material around the  laceration to help it heal or to repair the laceration.  Follow these instructions at home: Take over-the-counter and prescription medicines only as told by your health care provider. Do not take any other medicines unless you ask your health care provider about them first. Do not drive or use heavy machinery while taking prescription pain medicines. Rest and limit your activity as told by your health care provider. It may be several months before you can return to your usual routine. Do not participate in activities that involve physical contact or require extra energy until your health care provider approves. Keep all follow-up visits as told by your health care provider. This is important. Contact a health care provider if: Your abdominal pain does not go away. You feel more weak and tired than usual. Get help right away if: Your abdominal pain gets worse. You have a cut on your skin that: Has more  redness, swelling, or pain around it. Has more fluid or blood coming from it. Feels warm to the touch. Has pus or a bad smell coming from it. You feel dizzy or very weak. You have trouble breathing. You have a fever. This information is not intended to replace advice given to you by your health care provider. Make sure you discuss any questions you have with your health care provider. Document Released: 04/09/2010 Document Revised: 10/23/2015 Document Reviewed: 10/23/2015 Elsevier Interactive Patient Education  2018 [place]: POST OP INSTRUCTIONS  Always review your discharge instruction sheet given to you by the facility where your surgery was performed.  IF YOU HAVE DISABILITY OR FAMILY LEAVE FORMS, YOU MUST BRING THEM TO THE OFFICE FOR PROCESSING.  PLEASE DO NOT GIVE THEM TO YOUR DOCTOR.  1. A prescription for pain medication may be given to you upon discharge.  Take your pain medication as prescribed, if needed.  If narcotic pain medicine is not needed, then you may take acetaminophen (Tylenol) or ibuprofen (Advil) as needed. 2. Take your usually prescribed medications unless otherwise directed. 3. If you need a refill on your pain medication, please contact your pharmacy. They will contact our office to request authorization.  Prescriptions will not be filled after 5pm or on week-ends. 4. You should follow a light diet the first few days after arrival home, such as soup and crackers, pudding, etc.unless your doctor has advised otherwise. A high-fiber, low fat diet can be resumed as tolerated.   Be sure to include lots of fluids daily. Most patients will experience some swelling and bruising on the chest and neck area.  Ice packs will help.  Swelling and bruising can take several days to resolve 5. Most patients will experience some swelling and bruising in the area of the incision. Ice pack will help. Swelling and bruising can take several days to resolve..   6. It is common to experience some constipation if taking pain medication after surgery.  Increasing fluid intake and taking a stool softener will usually help or prevent this problem from occurring.  A mild laxative (Milk of Magnesia or Miralax) should be taken according to package directions if there are no bowel movements after 48 hours. 7.  You may have steri-strips (small skin tapes) in place directly over the incision.  These strips should be left on the skin for 7-10 days.  If your surgeon used skin glue on the incision, you may shower in 24 hours.  The glue will flake off over the next 2-3 weeks.  Any sutures  or staples will be removed at the office during your follow-up visit. You may find that a light gauze bandage over your incision may keep your staples from being rubbed or pulled. You may shower and replace the bandage daily. 8. ACTIVITIES:  You may resume regular (light) daily activities beginning the next day--such as daily self-care, walking, climbing stairs--gradually increasing activities as tolerated.  You may have sexual intercourse when it is comfortable.  Refrain from any heavy lifting or straining until approved by your doctor. a. You may drive when you no longer are taking prescription pain medication, you can comfortably wear a seatbelt, and you can safely maneuver your car and apply brakes b. Return to Work: ___________________________________ 3. You should see your doctor in the office for a follow-up appointment approximately two weeks after your surgery.  Make sure that you call for this appointment within a day or two after you arrive home to insure a convenient appointment time. OTHER INSTRUCTIONS:  _____________________________________________________________ _____________________________________________________________  WHEN TO CALL YOUR DOCTOR: 1. Fever over 101.0 2. Inability to urinate 3. Nausea and/or vomiting 4. Extreme swelling or bruising 5. Continued bleeding  from incision. 6. Increased pain, redness, or drainage from the incision. 7. Difficulty swallowing or breathing 8. Muscle cramping or spasms. 9. Numbness or tingling in hands or feet or around lips.  The clinic staff is available to answer your questions during regular business hours.  Please dont hesitate to call and ask to speak to one of the nurses if you have concerns.  For further questions, please visit [email or web address]

## 2017-08-05 ENCOUNTER — Encounter (HOSPITAL_COMMUNITY): Payer: Self-pay | Admitting: Emergency Medicine

## 2017-08-05 ENCOUNTER — Inpatient Hospital Stay (HOSPITAL_COMMUNITY)
Admission: EM | Admit: 2017-08-05 | Discharge: 2017-08-08 | DRG: 871 | Disposition: A | Discharge status: Home / Self Care | Payer: Self-pay | Attending: Internal Medicine | Admitting: Internal Medicine

## 2017-08-05 DIAGNOSIS — S2231XA Fracture of one rib, right side, initial encounter for closed fracture: Secondary | ICD-10-CM | POA: Diagnosis present

## 2017-08-05 DIAGNOSIS — S81032D Puncture wound without foreign body, left knee, subsequent encounter: Secondary | ICD-10-CM

## 2017-08-05 DIAGNOSIS — D75839 Thrombocytosis, unspecified: Secondary | ICD-10-CM | POA: Diagnosis present

## 2017-08-05 DIAGNOSIS — D649 Anemia, unspecified: Secondary | ICD-10-CM | POA: Diagnosis present

## 2017-08-05 DIAGNOSIS — S27809D Unspecified injury of diaphragm, subsequent encounter: Secondary | ICD-10-CM

## 2017-08-05 DIAGNOSIS — Y95 Nosocomial condition: Secondary | ICD-10-CM | POA: Diagnosis present

## 2017-08-05 DIAGNOSIS — Z1889 Other specified retained foreign body fragments: Secondary | ICD-10-CM

## 2017-08-05 DIAGNOSIS — R402414 Glasgow coma scale score 13-15, 24 hours or more after hospital admission: Secondary | ICD-10-CM | POA: Diagnosis not present

## 2017-08-05 DIAGNOSIS — W3400XD Accidental discharge from unspecified firearms or gun, subsequent encounter: Secondary | ICD-10-CM

## 2017-08-05 DIAGNOSIS — S2242XD Multiple fractures of ribs, left side, subsequent encounter for fracture with routine healing: Secondary | ICD-10-CM

## 2017-08-05 DIAGNOSIS — S36113D Laceration of liver, unspecified degree, subsequent encounter: Secondary | ICD-10-CM

## 2017-08-05 DIAGNOSIS — Z9889 Other specified postprocedural states: Secondary | ICD-10-CM

## 2017-08-05 DIAGNOSIS — D72829 Elevated white blood cell count, unspecified: Secondary | ICD-10-CM | POA: Diagnosis present

## 2017-08-05 DIAGNOSIS — S21131D Puncture wound without foreign body of right front wall of thorax without penetration into thoracic cavity, subsequent encounter: Secondary | ICD-10-CM

## 2017-08-05 DIAGNOSIS — S36039D Unspecified laceration of spleen, subsequent encounter: Secondary | ICD-10-CM

## 2017-08-05 DIAGNOSIS — S272XXS Traumatic hemopneumothorax, sequela: Secondary | ICD-10-CM

## 2017-08-05 DIAGNOSIS — A419 Sepsis, unspecified organism: Principal | ICD-10-CM | POA: Diagnosis present

## 2017-08-05 DIAGNOSIS — S272XXD Traumatic hemopneumothorax, subsequent encounter: Secondary | ICD-10-CM

## 2017-08-05 DIAGNOSIS — J189 Pneumonia, unspecified organism: Secondary | ICD-10-CM | POA: Diagnosis present

## 2017-08-05 DIAGNOSIS — D473 Essential (hemorrhagic) thrombocythemia: Secondary | ICD-10-CM | POA: Diagnosis present

## 2017-08-05 DIAGNOSIS — Z9119 Patient's noncompliance with other medical treatment and regimen: Secondary | ICD-10-CM

## 2017-08-05 HISTORY — DX: Sepsis, unspecified organism: A41.9

## 2017-08-05 HISTORY — DX: Pneumonia, unspecified organism: J18.9

## 2017-08-05 HISTORY — DX: Laceration of liver, unspecified degree, initial encounter: S36.113A

## 2017-08-05 HISTORY — DX: Accidental discharge from unspecified firearms or gun, initial encounter: W34.00XA

## 2017-08-05 HISTORY — DX: Fracture of one rib, right side, initial encounter for closed fracture: S22.31XA

## 2017-08-05 HISTORY — DX: Traumatic hemopneumothorax, initial encounter: S27.2XXA

## 2017-08-05 HISTORY — DX: Unspecified laceration of spleen, initial encounter: S36.039A

## 2017-08-05 LAB — COMPREHENSIVE METABOLIC PANEL
ALT: 42 U/L (ref 17–63)
ANION GAP: 10 (ref 5–15)
AST: 30 U/L (ref 15–41)
Albumin: 3.1 g/dL — ABNORMAL LOW (ref 3.5–5.0)
Alkaline Phosphatase: 106 U/L (ref 38–126)
BUN: 8 mg/dL (ref 6–20)
CO2: 24 mmol/L (ref 22–32)
Calcium: 9 mg/dL (ref 8.9–10.3)
Chloride: 104 mmol/L (ref 101–111)
Creatinine, Ser: 1.05 mg/dL (ref 0.61–1.24)
GFR calc Af Amer: >60 mL/min (ref >60)
GFR calc non Af Amer: >60 mL/min (ref >60)
GLUCOSE: 114 mg/dL — AB (ref 65–99)
POTASSIUM: 3.9 mmol/L (ref 3.5–5.1)
SODIUM: 138 mmol/L (ref 135–145)
TOTAL PROTEIN: 7.9 g/dL (ref 6.5–8.1)
Total Bilirubin: 0.8 mg/dL (ref 0.3–1.2)

## 2017-08-05 LAB — CBC WITH DIFFERENTIAL/PLATELET
ABS IMMATURE GRANULOCYTES: 0.3 10*3/uL — AB (ref 0.0–0.1)
BASOS ABS: 0 10*3/uL (ref 0.0–0.1)
BASOS PCT: 0 %
Eosinophils Absolute: 0.1 10*3/uL (ref 0.0–0.7)
Eosinophils Relative: 0 %
HCT: 29.5 % — ABNORMAL LOW (ref 39.0–52.0)
HEMOGLOBIN: 9.3 g/dL — AB (ref 13.0–17.0)
IMMATURE GRANULOCYTES: 1 %
LYMPHS PCT: 6 %
Lymphs Abs: 1.3 10*3/uL (ref 0.7–4.0)
MCH: 28.9 pg (ref 26.0–34.0)
MCHC: 31.5 g/dL (ref 30.0–36.0)
MCV: 91.6 fL (ref 78.0–100.0)
MONO ABS: 1.9 10*3/uL — AB (ref 0.1–1.0)
MONOS PCT: 8 %
NEUTROS PCT: 85 %
Neutro Abs: 19.6 10*3/uL — ABNORMAL HIGH (ref 1.7–7.7)
PLATELETS: 860 10*3/uL — AB (ref 150–400)
RBC: 3.22 MIL/uL — ABNORMAL LOW (ref 4.22–5.81)
RDW: 13.7 % (ref 11.5–15.5)
WBC: 23.1 10*3/uL — ABNORMAL HIGH (ref 4.0–10.5)

## 2017-08-05 LAB — I-STAT CG4 LACTIC ACID, ED: LACTIC ACID, VENOUS: 1.94 mmol/L — AB (ref 0.5–1.9)

## 2017-08-05 MED ORDER — ACETAMINOPHEN 325 MG PO TABS
650.0000 mg | ORAL_TABLET | Freq: Once | ORAL | Status: AC | PRN
  Administered 2017-08-05: 650 mg via ORAL
  Filled 2017-08-05: qty 2
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
## 2017-08-06 ENCOUNTER — Encounter (HOSPITAL_COMMUNITY): Payer: Self-pay | Admitting: Internal Medicine

## 2017-08-06 ENCOUNTER — Emergency Department (HOSPITAL_COMMUNITY): Payer: Self-pay

## 2017-08-06 DIAGNOSIS — A419 Sepsis, unspecified organism: Secondary | ICD-10-CM | POA: Diagnosis present

## 2017-08-06 DIAGNOSIS — S36039A Unspecified laceration of spleen, initial encounter: Secondary | ICD-10-CM | POA: Insufficient documentation

## 2017-08-06 DIAGNOSIS — D75839 Thrombocytosis, unspecified: Secondary | ICD-10-CM | POA: Diagnosis present

## 2017-08-06 DIAGNOSIS — D72829 Elevated white blood cell count, unspecified: Secondary | ICD-10-CM | POA: Diagnosis present

## 2017-08-06 DIAGNOSIS — S2231XA Fracture of one rib, right side, initial encounter for closed fracture: Secondary | ICD-10-CM | POA: Diagnosis present

## 2017-08-06 DIAGNOSIS — D473 Essential (hemorrhagic) thrombocythemia: Secondary | ICD-10-CM | POA: Diagnosis present

## 2017-08-06 DIAGNOSIS — S272XXA Traumatic hemopneumothorax, initial encounter: Secondary | ICD-10-CM | POA: Insufficient documentation

## 2017-08-06 DIAGNOSIS — J189 Pneumonia, unspecified organism: Secondary | ICD-10-CM | POA: Diagnosis present

## 2017-08-06 LAB — I-STAT TROPONIN, ED
TROPONIN I, POC: 0.02 ng/mL (ref 0.00–0.08)
Troponin i, poc: 0 ng/mL (ref 0.00–0.08)

## 2017-08-06 LAB — LACTIC ACID, PLASMA
LACTIC ACID, VENOUS: 1.3 mmol/L (ref 0.5–1.9)
Lactic Acid, Venous: 1 mmol/L (ref 0.5–1.9)

## 2017-08-06 LAB — PROTIME-INR
INR: 1.32
Prothrombin Time: 16.2 seconds — ABNORMAL HIGH (ref 11.4–15.2)

## 2017-08-06 LAB — APTT: APTT: 40 s — AB (ref 24–36)

## 2017-08-06 LAB — PROCALCITONIN: PROCALCITONIN: 0.61 ng/mL

## 2017-08-06 LAB — I-STAT CG4 LACTIC ACID, ED: LACTIC ACID, VENOUS: 1.8 mmol/L (ref 0.5–1.9)

## 2017-08-06 LAB — D-DIMER, QUANTITATIVE: D-Dimer, Quant: 4.88 ug/mL-FEU — ABNORMAL HIGH (ref 0.00–0.50)

## 2017-08-06 LAB — LIPASE, BLOOD: Lipase: 25 U/L (ref 11–51)

## 2017-08-06 MED ORDER — ENOXAPARIN SODIUM 40 MG/0.4ML ~~LOC~~ SOLN
40.0000 mg | SUBCUTANEOUS | Status: DC
  Administered 2017-08-06: 40 mg via SUBCUTANEOUS
  Filled 2017-08-06 (×2): qty 0.4

## 2017-08-06 MED ORDER — SODIUM CHLORIDE 0.9 % IV SOLN
2.0000 g | Freq: Once | INTRAVENOUS | Status: AC
  Administered 2017-08-06: 2 g via INTRAVENOUS
  Filled 2017-08-06: qty 2

## 2017-08-06 MED ORDER — SODIUM CHLORIDE 0.9 % IV SOLN
INTRAVENOUS | Status: DC
  Administered 2017-08-06 – 2017-08-07 (×3): via INTRAVENOUS

## 2017-08-06 MED ORDER — IOPAMIDOL (ISOVUE-370) INJECTION 76%
100.0000 mL | Freq: Once | INTRAVENOUS | Status: AC | PRN
  Administered 2017-08-06: 100 mL via INTRAVENOUS

## 2017-08-06 MED ORDER — ACETAMINOPHEN 325 MG PO TABS: 650.0000 mg | ORAL_TABLET | Freq: Four times a day (QID) | ORAL | Status: DC | PRN

## 2017-08-06 MED ORDER — ACETAMINOPHEN 650 MG RE SUPP: 650.0000 mg | Freq: Four times a day (QID) | RECTAL | Status: DC | PRN

## 2017-08-06 MED ORDER — VANCOMYCIN HCL IN DEXTROSE 1-5 GM/200ML-% IV SOLN
1000.0000 mg | Freq: Once | INTRAVENOUS | Status: AC
  Administered 2017-08-06: 1000 mg via INTRAVENOUS
  Filled 2017-08-06: qty 200

## 2017-08-06 MED ORDER — SODIUM CHLORIDE 0.9 % IV BOLUS
1000.0000 mL | Freq: Once | INTRAVENOUS | Status: AC
  Administered 2017-08-06: 1000 mL via INTRAVENOUS

## 2017-08-06 MED ORDER — SODIUM CHLORIDE 0.9 % IV BOLUS (SEPSIS)
1000.0000 mL | Freq: Once | INTRAVENOUS | Status: AC
  Administered 2017-08-06: 1000 mL via INTRAVENOUS

## 2017-08-06 MED ORDER — ONDANSETRON HCL 4 MG/2ML IJ SOLN
4.0000 mg | Freq: Once | INTRAMUSCULAR | Status: AC
  Administered 2017-08-06: 4 mg via INTRAVENOUS
  Filled 2017-08-06: qty 2

## 2017-08-06 MED ORDER — ONDANSETRON HCL 4 MG/2ML IJ SOLN
4.0000 mg | Freq: Four times a day (QID) | INTRAMUSCULAR | Status: DC | PRN
  Administered 2017-08-06: 4 mg via INTRAVENOUS
  Filled 2017-08-06: qty 2

## 2017-08-06 MED ORDER — IOPAMIDOL (ISOVUE-370) INJECTION 76%
INTRAVENOUS | Status: AC
  Filled 2017-08-06: qty 100

## 2017-08-06 MED ORDER — GUAIFENESIN ER 600 MG PO TB12
600.0000 mg | ORAL_TABLET | Freq: Two times a day (BID) | ORAL | Status: DC
  Administered 2017-08-06 – 2017-08-07 (×4): 600 mg via ORAL
  Filled 2017-08-06 (×6): qty 1

## 2017-08-06 MED ORDER — PROMETHAZINE HCL 25 MG/ML IJ SOLN
12.5000 mg | Freq: Four times a day (QID) | INTRAMUSCULAR | Status: DC | PRN
  Administered 2017-08-07: 12.5 mg via INTRAVENOUS
  Filled 2017-08-06 (×2): qty 1

## 2017-08-06 MED ORDER — HYDROCODONE-ACETAMINOPHEN 5-325 MG PO TABS
1.0000 | ORAL_TABLET | ORAL | Status: DC | PRN
  Administered 2017-08-06 – 2017-08-08 (×4): 1 via ORAL
  Filled 2017-08-06 (×5): qty 1

## 2017-08-06 MED ORDER — ENOXAPARIN SODIUM 40 MG/0.4ML ~~LOC~~ SOLN
40.0000 mg | SUBCUTANEOUS | Status: DC
  Filled 2017-08-06: qty 0.4

## 2017-08-06 MED ORDER — VANCOMYCIN HCL IN DEXTROSE 750-5 MG/150ML-% IV SOLN
750.0000 mg | Freq: Three times a day (TID) | INTRAVENOUS | Status: DC
  Administered 2017-08-06 – 2017-08-08 (×5): 750 mg via INTRAVENOUS
  Filled 2017-08-06 (×7): qty 150

## 2017-08-06 MED ORDER — KETOROLAC TROMETHAMINE 15 MG/ML IJ SOLN
15.0000 mg | Freq: Four times a day (QID) | INTRAMUSCULAR | Status: DC | PRN
  Administered 2017-08-06: 15 mg via INTRAVENOUS
  Filled 2017-08-06: qty 1

## 2017-08-06 MED ORDER — SODIUM CHLORIDE 0.9 % IV SOLN
1.0000 g | Freq: Three times a day (TID) | INTRAVENOUS | Status: DC
  Administered 2017-08-06 – 2017-08-08 (×5): 1 g via INTRAVENOUS
  Filled 2017-08-06 (×9): qty 1

## 2017-08-06 MED ORDER — ONDANSETRON HCL 4 MG PO TABS: 4.0000 mg | ORAL_TABLET | Freq: Four times a day (QID) | ORAL | Status: DC | PRN

## 2017-08-06 MED ORDER — SENNOSIDES-DOCUSATE SODIUM 8.6-50 MG PO TABS: 1.0000 | ORAL_TABLET | Freq: Every evening | ORAL | Status: DC | PRN

## 2017-08-06 NOTE — Progress Notes (Signed)
Pharmacy Antibiotic Note  James Little is a 22 y.o. male admitted on 08/05/2017 with pneumonia.  Pharmacy has been consulted for vancomycin and cefepime dosing. Initial doses ordered in the ED Plan: Continue vancomycin 750 mg IV q8 hours Cont cefepime 1gm IV q8 hours F/u renal function, cultures and clinical course  Height: 6' (182.9 cm) Weight: 145 lb (65.8 kg) IBW/kg (Calculated) : 77.6  Temp (24hrs), Avg:99.3 F (37.4 C), Min:98.1 F (36.7 C), Max:100.4 F (38 C)  Recent Labs  Lab 08/05/17 2107 08/05/17 2117 08/06/17 0134  WBC 23.1*  --   --   CREATININE 1.05  --   --   LATICACIDVEN  --  1.94* 1.80    Estimated Creatinine Clearance: 102.7 mL/min (by C-G formula based on SCr of 1.05 mg/dL).    No Known Allergies   Thank you for allowing pharmacy to be a part of this patient's care.  Excell Seltzer Poteet 08/06/2017 2:47 AM
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
## 2017-08-06 NOTE — ED Notes (Signed)
Pt diaphoretic.  Chills.  IV access obtained and repeat lactic drawn.  Pt in NAD at this time.

## 2017-08-06 NOTE — Progress Notes (Signed)
James Little is a 22 y.o. male patient admitted from ED awake, alert - oriented  X 4 - no acute distress noted.  VSS - Blood pressure 125/68, pulse 92, temperature 98.1 F (36.7 C), temperature source Oral, resp. rate (!) 21, height 6' (1.829 m), weight 65.8 kg (145 lb), SpO2 99 %.    IV in place, occlusive dsg intact without redness.  Orientation to room, and floor completed with information packet given to patient/family.  Patient declined safety video at this time.  Admission INP armband ID verified with patient/family, and in place.   SR up x 2, fall assessment complete, with patient and family able to verbalize understanding of risk associated with falls, and verbalized understanding to call nsg before up out of bed.  Call light within reach, patient able to voice, and demonstrate understanding.    Will cont to eval and treat per MD orders.  Betha Loa Lasean Rahming, RN 08/06/2017 3:14 PM

## 2017-08-06 NOTE — ED Notes (Signed)
Pt. Called x2 for room no answer.

## 2017-08-06 NOTE — ED Notes (Signed)
Attempted to call report
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
## 2017-08-07 ENCOUNTER — Other Ambulatory Visit: Payer: Self-pay

## 2017-08-07 DIAGNOSIS — Z9889 Other specified postprocedural states: Secondary | ICD-10-CM

## 2017-08-07 DIAGNOSIS — S272XXS Traumatic hemopneumothorax, sequela: Secondary | ICD-10-CM

## 2017-08-07 DIAGNOSIS — S36039D Unspecified laceration of spleen, subsequent encounter: Secondary | ICD-10-CM

## 2017-08-07 LAB — BASIC METABOLIC PANEL
Anion gap: 9 (ref 5–15)
BUN: 5 mg/dL — AB (ref 6–20)
CALCIUM: 8.3 mg/dL — AB (ref 8.9–10.3)
CO2: 24 mmol/L (ref 22–32)
CREATININE: 0.97 mg/dL (ref 0.61–1.24)
Chloride: 109 mmol/L (ref 101–111)
GFR calc Af Amer: >60 mL/min (ref >60)
Glucose, Bld: 105 mg/dL — ABNORMAL HIGH (ref 65–99)
POTASSIUM: 4.3 mmol/L (ref 3.5–5.1)
SODIUM: 142 mmol/L (ref 135–145)

## 2017-08-07 LAB — CBC
HEMATOCRIT: 25.6 % — AB (ref 39.0–52.0)
Hemoglobin: 8 g/dL — ABNORMAL LOW (ref 13.0–17.0)
MCH: 28.6 pg (ref 26.0–34.0)
MCHC: 31.3 g/dL (ref 30.0–36.0)
MCV: 91.4 fL (ref 78.0–100.0)
PLATELETS: 710 10*3/uL — AB (ref 150–400)
RBC: 2.8 MIL/uL — ABNORMAL LOW (ref 4.22–5.81)
RDW: 14.1 % (ref 11.5–15.5)
WBC: 15.7 10*3/uL — ABNORMAL HIGH (ref 4.0–10.5)

## 2017-08-07 LAB — URINALYSIS, ROUTINE W REFLEX MICROSCOPIC
BACTERIA UA: NONE SEEN
BILIRUBIN URINE: NEGATIVE
Glucose, UA: NEGATIVE mg/dL
Hgb urine dipstick: NEGATIVE
KETONES UR: NEGATIVE mg/dL
LEUKOCYTES UA: NEGATIVE
NITRITE: NEGATIVE
Protein, ur: 30 mg/dL — AB
SPECIFIC GRAVITY, URINE: 1.029 (ref 1.005–1.030)
pH: 5 (ref 5.0–8.0)
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
## 2017-08-08 ENCOUNTER — Encounter (HOSPITAL_COMMUNITY): Payer: Self-pay | Admitting: *Deleted

## 2017-08-08 DIAGNOSIS — J189 Pneumonia, unspecified organism: Secondary | ICD-10-CM

## 2017-08-08 LAB — BASIC METABOLIC PANEL
Anion gap: 7 (ref 5–15)
CALCIUM: 8.4 mg/dL — AB (ref 8.9–10.3)
CO2: 25 mmol/L (ref 22–32)
Chloride: 109 mmol/L (ref 101–111)
Creatinine, Ser: 0.87 mg/dL (ref 0.61–1.24)
GFR calc Af Amer: >60 mL/min (ref >60)
GLUCOSE: 105 mg/dL — AB (ref 65–99)
Potassium: 3.5 mmol/L (ref 3.5–5.1)
Sodium: 141 mmol/L (ref 135–145)

## 2017-08-08 LAB — CBC
HCT: 26.5 % — ABNORMAL LOW (ref 39.0–52.0)
Hemoglobin: 8.3 g/dL — ABNORMAL LOW (ref 13.0–17.0)
MCH: 28.3 pg (ref 26.0–34.0)
MCHC: 31.3 g/dL (ref 30.0–36.0)
MCV: 90.4 fL (ref 78.0–100.0)
PLATELETS: 708 10*3/uL — AB (ref 150–400)
RBC: 2.93 MIL/uL — ABNORMAL LOW (ref 4.22–5.81)
RDW: 14.6 % (ref 11.5–15.5)
WBC: 13.9 10*3/uL — ABNORMAL HIGH (ref 4.0–10.5)

## 2017-08-08 MED ORDER — ACETAMINOPHEN 500 MG PO TABS: 1000.0000 mg | ORAL_TABLET | Freq: Four times a day (QID) | ORAL | 0 refills | Status: AC | PRN

## 2017-08-08 MED ORDER — AMOXICILLIN-POT CLAVULANATE 875-125 MG PO TABS: 1.0000 | ORAL_TABLET | Freq: Two times a day (BID) | ORAL | 0 refills | Status: AC

## 2017-08-08 NOTE — Progress Notes (Signed)
[place] to be D/C'd Home per MD order.  Discussed with the patient and all questions fully answered.  IV catheter discontinued intact. Site without signs and symptoms of complications. Dressing and pressure applied.  An After Visit Summary was printed and given to the patient. Patient received prescription.  D/c education completed with patient/family including follow up instructions, medication list, d/c activities limitations if indicated, with other d/c instructions as indicated by MD - patient able to verbalize understanding, all questions fully answered.   Patient instructed to return to ED, call 911, or call MD for any changes in condition.   Patient escorted via [facility], and D/C home via private auto.  Betha Loa Talan Gildner 08/08/2017 2:32 PM
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
## 2017-08-08 NOTE — Progress Notes (Signed)
Patient discharge instructions given and IV removed. Patient verbalized understanding of discharge paper work using teach back.

## 2017-08-11 LAB — CULTURE, BLOOD (ROUTINE X 2)
CULTURE: NO GROWTH
Culture: NO GROWTH
SPECIAL REQUESTS: ADEQUATE
SPECIAL REQUESTS: ADEQUATE

## 2017-09-05 ENCOUNTER — Ambulatory Visit (INDEPENDENT_AMBULATORY_CARE_PROVIDER_SITE_OTHER): Payer: Self-pay | Admitting: Physician Assistant

## 2018-05-09 ENCOUNTER — Emergency Department (HOSPITAL_COMMUNITY)
Admission: EM | Admit: 2018-05-09 | Discharge: 2018-05-09 | Discharge status: Against Medical Advice | Payer: Medicaid Other | Attending: Emergency Medicine | Admitting: Emergency Medicine

## 2018-05-09 DIAGNOSIS — Z5321 Procedure and treatment not carried out due to patient leaving prior to being seen by health care provider: Secondary | ICD-10-CM | POA: Insufficient documentation

## 2018-05-09 NOTE — ED Notes (Signed)
[place], EMT attempted to call pt back for triage at this time with no answer

## 2018-05-09 NOTE — ED Notes (Signed)
Called for triage 2x

## 2018-05-09 NOTE — ED Notes (Signed)
Called for triage, no answer
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
